# Patient Record
Sex: Female | Born: 1950 | Race: White | Hispanic: No | Marital: Single | Smoking: Never smoker
Health system: Southern US, Community
[De-identification: ages and names within clinical notes are randomized; demographics above are authoritative.]

## PROBLEM LIST (undated history)

## (undated) DIAGNOSIS — I10 Essential (primary) hypertension: Secondary | ICD-10-CM

---

## 2020-09-07 ENCOUNTER — Emergency Department: Payer: Medicare Other

## 2020-09-07 ENCOUNTER — Emergency Department
Admission: EM | Admit: 2020-09-07 | Discharge: 2020-09-07 | Disposition: A | Discharge status: Home / Self Care | Payer: Medicare Other | Attending: Emergency Medicine | Admitting: Emergency Medicine

## 2020-09-07 ENCOUNTER — Other Ambulatory Visit: Payer: Self-pay

## 2020-09-07 DIAGNOSIS — I10 Essential (primary) hypertension: Secondary | ICD-10-CM | POA: Insufficient documentation

## 2020-09-07 DIAGNOSIS — S52591A Other fractures of lower end of right radius, initial encounter for closed fracture: Secondary | ICD-10-CM | POA: Diagnosis not present

## 2020-09-07 DIAGNOSIS — W010XXA Fall on same level from slipping, tripping and stumbling without subsequent striking against object, initial encounter: Secondary | ICD-10-CM | POA: Diagnosis not present

## 2020-09-07 DIAGNOSIS — S6991XA Unspecified injury of right wrist, hand and finger(s), initial encounter: Secondary | ICD-10-CM | POA: Diagnosis present

## 2020-09-07 DIAGNOSIS — W19XXXA Unspecified fall, initial encounter: Secondary | ICD-10-CM

## 2020-09-07 HISTORY — DX: Essential (primary) hypertension: I10

## 2020-09-07 MED ORDER — OXYCODONE-ACETAMINOPHEN 5-325 MG PO TABS: 1.0000 | ORAL_TABLET | Freq: Four times a day (QID) | ORAL | 0 refills | Status: AC | PRN

## 2020-09-07 MED ORDER — OXYCODONE-ACETAMINOPHEN 5-325 MG PO TABS
1.0000 | ORAL_TABLET | Freq: Once | ORAL | Status: AC
  Administered 2020-09-07: 1 via ORAL
  Filled 2020-09-07: qty 1

## 2020-09-07 NOTE — ED Provider Notes (Signed)
[facility] Emergency Department Provider Note  ____________________________________________  Time seen: Approximately 4:01 PM  I have reviewed the triage vital signs and the nursing notes.   HISTORY  Chief Complaint Fall    HPI Tonya Lane is a 70 y.o. female who presents the emergency department complaining of right wrist pain after fall.  Patient tripped, fell, try to catch herself with an outstretched right hand.  Patient had an injury to the right wrist.  She states that she has had deformity to the wrist.  She has good range of motion of all digits, sensation is intact.  She denied any other injury.  She did not hit her head or lose consciousness.  She denies any other musculoskeletal complaints, no chest pain, shortness of breath or abdominal pain.  No history of previous injury to this wrist.  Only medical history is hypertension.         Past Medical History:  Diagnosis Date  . Hypertension     There are no problems to display for this patient.   History reviewed. No pertinent surgical history.  Prior to Admission medications   Medication Sig Start Date End Date Taking? Authorizing Provider  oxyCODONE-acetaminophen (PERCOCET/ROXICET) 5-325 MG tablet Take 1 tablet by mouth every 6 (six) hours as needed for severe pain. 09/07/20  Yes Dajuana Palen, Delorise Royals, PA-C    Allergies Patient has no allergy information on record.  History reviewed. No pertinent family history.  Social History Social History   Tobacco Use  . Smoking status: Never Smoker     Review of Systems  Constitutional: No fever/chills Eyes: No visual changes. No discharge ENT: No upper respiratory complaints. Cardiovascular: no chest pain. Respiratory: no cough. No SOB. Gastrointestinal: No abdominal pain.  No nausea, no vomiting.  No diarrhea.  No constipation. Musculoskeletal: Right wrist pain/injury Skin: Negative for rash, abrasions, lacerations,  ecchymosis. Neurological: Negative for headaches, focal weakness or numbness.  10 System ROS otherwise negative.  ____________________________________________   PHYSICAL EXAM:  VITAL SIGNS: ED Triage Vitals  Enc Vitals Group     BP 09/07/20 1401 (!) 207/101     Pulse Rate 09/07/20 1401 74     Resp 09/07/20 1401 18     Temp 09/07/20 1401 98.3 F (36.8 C)     Temp Source 09/07/20 1401 Oral     SpO2 09/07/20 1401 100 %     Weight 09/07/20 1402 112 lb (50.8 kg)     Height 09/07/20 1402 5\' 2"  (1.575 m)     Head Circumference --      Peak Flow --      Pain Score 09/07/20 1409 6     Pain Loc --      Pain Edu? --      Excl. in GC? --      Constitutional: Alert and oriented. Well appearing and in no acute distress. Eyes: Conjunctivae are normal. PERRL. EOMI. Head: Atraumatic. ENT:      Ears:       Nose: No congestion/rhinnorhea.      Mouth/Throat: Mucous membranes are moist.  Neck: No stridor.    Cardiovascular: Normal rate, regular rhythm. Normal S1 and S2.  Good peripheral circulation. Respiratory: Normal respiratory effort without tachypnea or retractions. Lungs CTAB. Good air entry to the bases with no decreased or absent breath sounds. Musculoskeletal: Full range of motion to all extremities. No gross deformities appreciated.  Visualization of the right wrist reveals deformity about the distal wrist.  No lacerations, abrasions  noted.  Limited range of motion of the wrist due to injury and pain.  Full range of motion to the digits and elbow.  Patient with palpable abnormality about the distal radius with no other significant palpable findings.  Very tender in this region as well.  Sensation intact all digits.  Capillary refill less than 2 seconds all digits. Neurologic:  Normal speech and language. No gross focal neurologic deficits are appreciated.  Skin:  Skin is warm, dry and intact. No rash noted. Psychiatric: Mood and affect are normal. Speech and behavior are normal.  Patient exhibits appropriate insight and judgement.   ____________________________________________   LABS (all labs ordered are listed, but only abnormal results are displayed)  Labs Reviewed - No data to display ____________________________________________  EKG   ____________________________________________  RADIOLOGY I personally viewed and evaluated these images as part of my medical decision making, as well as reviewing the written report by the radiologist.  ED Provider Interpretation: Comminuted, minimally displaced, slightly angulated distal radius fracture  DG Wrist Complete Right  Result Date: 09/07/2020 CLINICAL DATA:  Fall, right wrist and thumb swelling and pain EXAM: RIGHT WRIST - COMPLETE 3+ VIEW COMPARISON:  None. FINDINGS: Comminuted distal metaphysis fracture in the right radius with mild impaction, mild 2 mm dorsal displacement of the distal fracture fragment and mild apex volar angulation. Surrounding radial right wrist soft tissue swelling. No additional fracture. No dislocation. Mild-to-moderate first carpometacarpal joint osteoarthritis. Mild first MCP joint osteoarthritis. Mild interphalangeal joint right thumb osteoarthritis. No radiopaque foreign bodies. No suspicious focal osseous lesions. IMPRESSION: 1. Comminuted distal right radius fracture with mild impaction, mild displacement and angulation as detailed. Surrounding radial right wrist soft tissue swelling. 2. 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 Electronically Signed   By: Delbert Phenix M.D.   On: 09/07/2020 15:00    ____________________________________________    PROCEDURES  Procedure(s) performed:    .Splint Application  Date/Time: 09/07/2020 4:12 PM Performed by: Racheal Patches, PA-C Authorized by: Racheal Patches, PA-C   Consent:    Consent obtained:  Verbal   Consent given by:  Patient   Risks discussed:  Pain, swelling and numbness Universal protocol:    Procedure  explained and questions answered to patient or proxy's satisfaction: yes     Patient identity confirmed:  Verbally with patient Pre-procedure details:    Distal neurologic exam:  Normal   Distal perfusion: distal pulses strong and brisk capillary refill   Procedure details:    Location:  Wrist   Wrist location:  R wrist   Splint type:  Volar short arm   Supplies:  Cotton padding, fiberglass and elastic bandage Post-procedure details:    Distal neurologic exam:  Normal   Distal perfusion: distal pulses strong and brisk capillary refill     Procedure completion:  Tolerated well, no immediate complications   Post-procedure imaging: not applicable        Medications  oxyCODONE-acetaminophen (PERCOCET/ROXICET) 5-325 MG per tablet 1 tablet (1 tablet Oral Given 09/07/20 1557)     ____________________________________________   INITIAL IMPRESSION / ASSESSMENT AND PLAN / ED COURSE  Pertinent labs & imaging results that were available during my care of the patient were reviewed by me and considered in my medical decision making (see chart for details).  Review of the [place] CSRS was performed in accordance of the [facility] prior to dispensing any controlled drugs.           Patient's diagnosis is consistent with distal radius fracture secondary to  mechanical fall.  Patient presented to the emergency department secondary to a fall onto an outstretched hand.  X-ray reveals findings consistent with distal radius fracture to the right wrist which is consistent with physical exam.  No concerning neurovascular deficits on exam.  Patient will be splinted, prescribed pain medication and referred to orthopedics for follow-up.  I will place a social work consult outpatient for additional home health resources as patient lives by herself and is right-hand dominant..  Follow-up with orthopedics.  Return precautions discussed with the patient.    ____________________________________________  FINAL  CLINICAL IMPRESSION(S) / ED DIAGNOSES  Final diagnoses:  Fall, initial encounter  Other closed fracture of distal end of right radius, initial encounter      NEW MEDICATIONS STARTED DURING THIS VISIT:  ED Discharge Orders         Ordered    oxyCODONE-acetaminophen (PERCOCET/ROXICET) 5-325 MG tablet  Every 6 hours PRN        09/07/20 1557    Home Health        09/07/20 1649    Face-to-face encounter (required for Medicare/Medicaid patients)       Comments: I Chesley Noon certify that this patient is under my care and that I, or a nurse practitioner or physician's assistant working with me, had a face-to-face encounter that meets the physician face-to-face encounter requirements with this patient on 09/07/2020. The encounter with the patient was in whole, or in part for the following medical condition(s) which is the primary reason for home health care (List medical condition): Wrist fracture   09/07/20 1649              This chart was dictated using voice recognition software/Dragon. Despite best efforts to proofread, errors can occur which can change the meaning. Any change was purely unintentional.    Racheal Patches, PA-C 09/07/20 1653    Chesley Noon, MD 09/07/20 [phone]

## 2020-09-07 NOTE — ED Triage Notes (Signed)
Pt tripped over cord and fell and tried to brace herself with right wrist. Pt denies LOC, or hitting head. Pt denies dizziness before fall. Pt with right wrist swollen and painful right thumb. Pt alert and oriented. Capillary refill and pulses WNL in right hand/wrist.

## 2020-09-08 NOTE — TOC Progression Note (Signed)
Transition of Care [facility]) - Progression Note    Patient Details  Name: Tonya Lane MRN: [id number] Date of Birth: 02/07/1951  Transition of [facility] CM/SW Contact  Marina Goodell Phone Number: [phone] 09/08/2020, 3:11 PM  Clinical Narrative:     Patient presents to ED due to fall and wrist pain.  Patient was discharged with home health orders, but not TOC consult was placed.  Patient contacted ED RN about home health services.  CSW called and left voicemail requesting patient return call.       Expected Discharge Plan and Services                                                 Social Determinants of Health (SDOH) Interventions    Readmission Risk Interventions No flowsheet data found.

## 2022-03-18 IMAGING — DX DG WRIST COMPLETE 3+V*R*
3 series · 3 of 3 positions shown · non-contrast
Comparison: None.

CLINICAL DATA: Fall, right wrist and thumb swelling and pain

EXAM:
RIGHT WRIST - COMPLETE 3+ VIEW

[wrist ap]
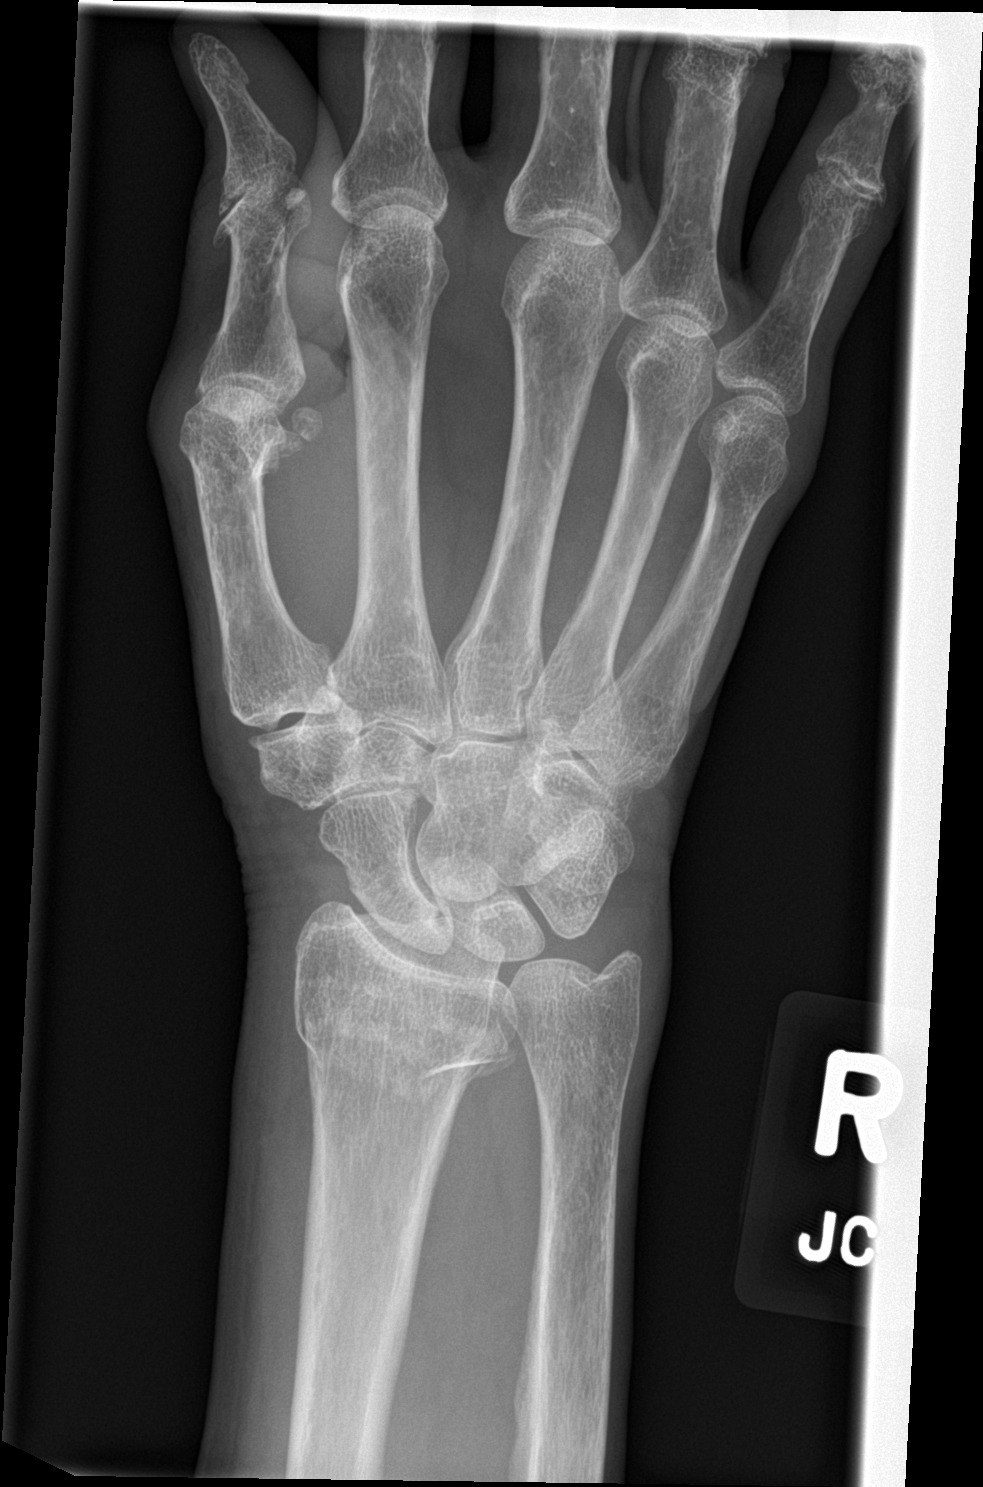

[wrist obl]
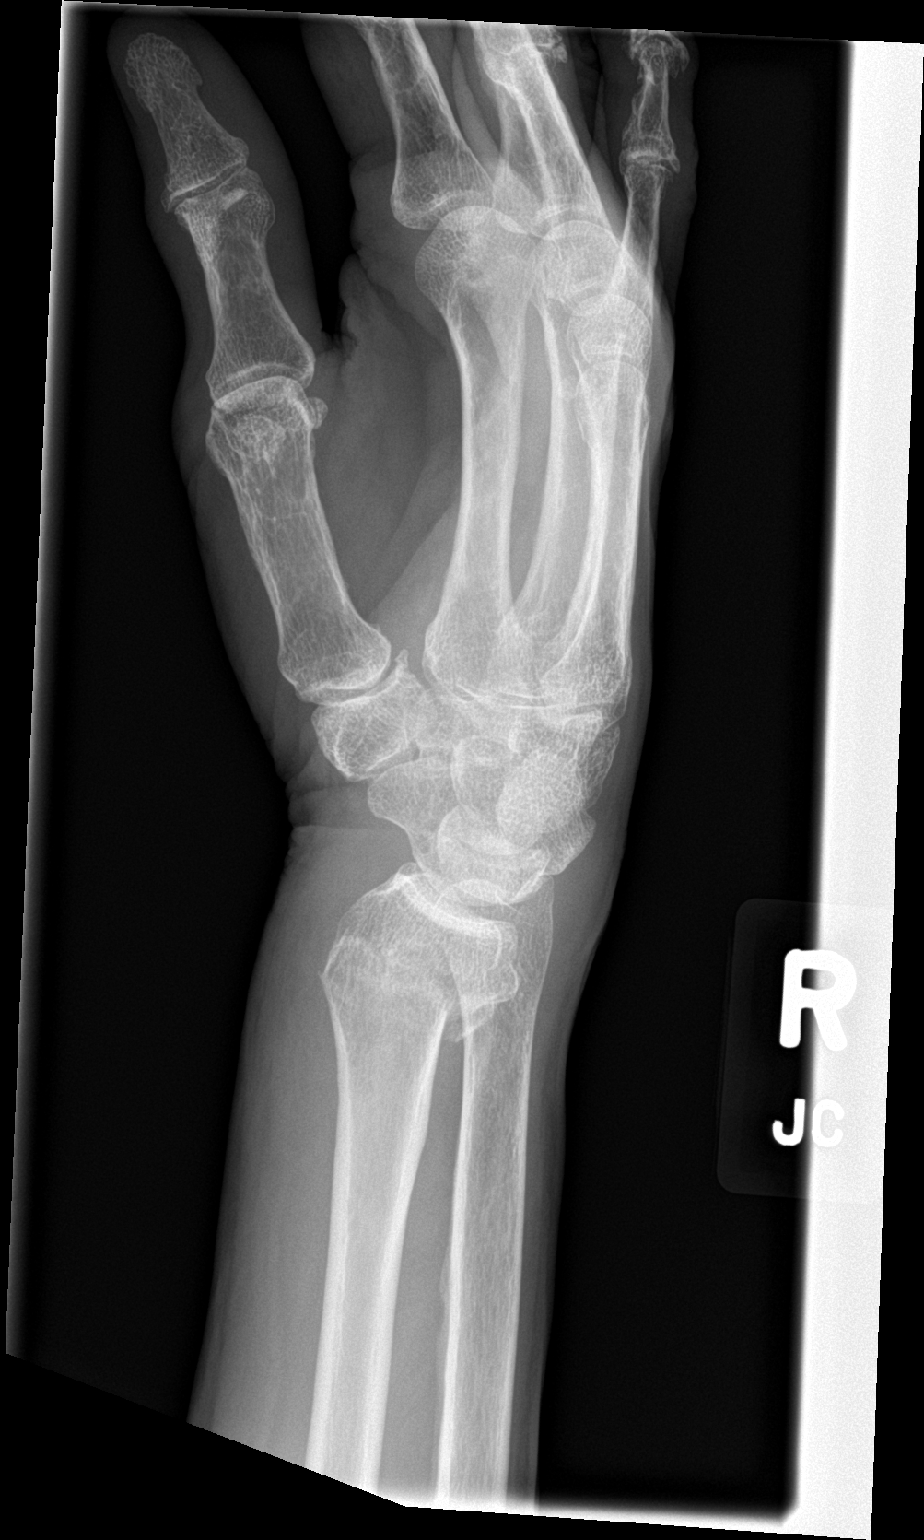

[wrist lat]
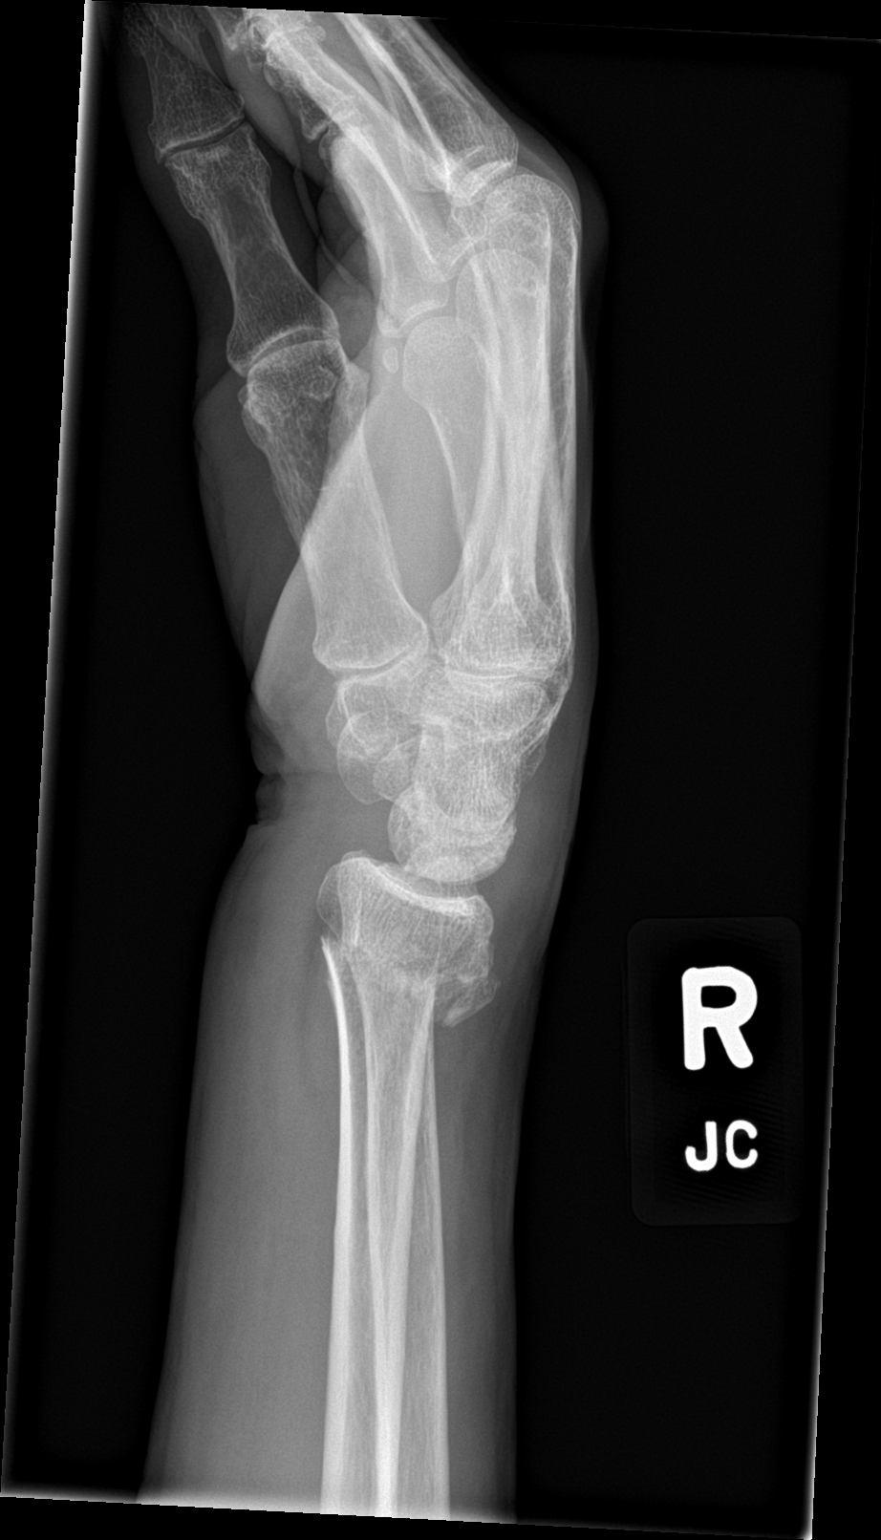

[3 of 3 positions shown; findings below may reference images not displayed]

FINDINGS: Comminuted distal metaphysis fracture in the right radius with mild
impaction, mild 2 mm dorsal displacement of the distal fracture
fragment and mild apex volar angulation. Surrounding radial right
wrist soft tissue swelling. No additional fracture. No dislocation.
Mild-to-moderate first carpometacarpal joint osteoarthritis. Mild
first MCP joint osteoarthritis. Mild interphalangeal joint right
thumb osteoarthritis. No radiopaque foreign bodies. No suspicious
focal osseous lesions.
IMPRESSION: 1. Comminuted distal right radius fracture with mild impaction, mild
displacement and angulation as detailed. Surrounding radial right
wrist soft tissue swelling.
2. Mild to moderate polyarticular osteoarthritis.
